# Patient Record
Sex: Male | Born: 1995 | Race: Black or African American | Hispanic: No | Marital: Single | Smoking: Current every day smoker
Health system: Southern US, Community
[De-identification: ages and names within clinical notes are randomized; demographics above are authoritative.]

## PROBLEM LIST (undated history)

## (undated) DIAGNOSIS — J45909 Unspecified asthma, uncomplicated: Secondary | ICD-10-CM

---
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
## 2013-03-20 ENCOUNTER — Emergency Department (HOSPITAL_COMMUNITY)
Admission: EM | Admit: 2013-03-20 | Discharge: 2013-03-20 | Disposition: A | Discharge status: Home / Self Care | Payer: Medicaid Other | Attending: Emergency Medicine | Admitting: Emergency Medicine

## 2013-03-20 ENCOUNTER — Encounter (HOSPITAL_COMMUNITY): Payer: Self-pay | Admitting: Emergency Medicine

## 2013-03-20 DIAGNOSIS — F17200 Nicotine dependence, unspecified, uncomplicated: Secondary | ICD-10-CM | POA: Insufficient documentation

## 2013-03-20 DIAGNOSIS — IMO0002 Reserved for concepts with insufficient information to code with codable children: Secondary | ICD-10-CM | POA: Insufficient documentation

## 2013-03-20 DIAGNOSIS — H11421 Conjunctival edema, right eye: Secondary | ICD-10-CM

## 2013-03-20 DIAGNOSIS — Y929 Unspecified place or not applicable: Secondary | ICD-10-CM | POA: Insufficient documentation

## 2013-03-20 DIAGNOSIS — Y939 Activity, unspecified: Secondary | ICD-10-CM | POA: Insufficient documentation

## 2013-03-20 DIAGNOSIS — S0591XA Unspecified injury of right eye and orbit, initial encounter: Secondary | ICD-10-CM

## 2013-03-20 DIAGNOSIS — J45909 Unspecified asthma, uncomplicated: Secondary | ICD-10-CM | POA: Insufficient documentation

## 2013-03-20 DIAGNOSIS — S0510XA Contusion of eyeball and orbital tissues, unspecified eye, initial encounter: Secondary | ICD-10-CM | POA: Insufficient documentation

## 2013-03-20 DIAGNOSIS — H11429 Conjunctival edema, unspecified eye: Secondary | ICD-10-CM | POA: Insufficient documentation

## 2013-03-20 MED ORDER — OXYCODONE-ACETAMINOPHEN 5-325 MG PO TABS: 1.0000 | ORAL_TABLET | Freq: Four times a day (QID) | ORAL | Status: DC | PRN

## 2013-03-20 MED ORDER — OXYCODONE-ACETAMINOPHEN 5-325 MG PO TABS
2.0000 | ORAL_TABLET | Freq: Once | ORAL | Status: AC
  Administered 2013-03-20: 2 via ORAL
  Filled 2013-03-20: qty 2

## 2013-03-20 MED ORDER — FLUORESCEIN SODIUM 1 MG OP STRP
1.0000 | ORAL_STRIP | Freq: Once | OPHTHALMIC | Status: AC
  Administered 2013-03-20: 1 via OPHTHALMIC
  Filled 2013-03-20: qty 1

## 2013-03-20 MED ORDER — TETRACAINE HCL 0.5 % OP SOLN
2.0000 [drp] | Freq: Once | OPHTHALMIC | Status: AC
  Administered 2013-03-20: 2 [drp] via OPHTHALMIC
  Filled 2013-03-20: qty 2

## 2013-03-20 NOTE — ED Provider Notes (Signed)
Medical screening examination/treatment/procedure(s) were conducted as a shared visit with non-physician practitioner(s) and myself.  I personally evaluated the patient during the encounter.  EKG Interpretation   None      Patient's right eye with chemosis but without signs of ocular entrapment or hyphema. Possible corneal abrasion. Denies any foreign body sensation however. Patient's vision is normal.  Toy Baker, MD 03/20/13 2204
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
## 2013-03-23 NOTE — ED Provider Notes (Signed)
Medical screening examination/treatment/procedure(s) were conducted as a shared visit with non-physician practitioner(s) and myself.  I personally evaluated the patient during the encounter.  EKG Interpretation   None        Toy Baker, MD 03/23/13 [phone]

## 2015-03-13 ENCOUNTER — Encounter (HOSPITAL_COMMUNITY): Payer: Self-pay | Admitting: Nurse Practitioner

## 2015-03-13 ENCOUNTER — Emergency Department (HOSPITAL_COMMUNITY)
Admission: EM | Admit: 2015-03-13 | Discharge: 2015-03-13 | Disposition: A | Discharge status: Home / Self Care | Payer: Self-pay | Attending: Emergency Medicine | Admitting: Emergency Medicine

## 2015-03-13 ENCOUNTER — Emergency Department (HOSPITAL_COMMUNITY): Payer: Self-pay

## 2015-03-13 ENCOUNTER — Emergency Department (HOSPITAL_COMMUNITY): Payer: Medicaid Other

## 2015-03-13 DIAGNOSIS — W010XXA Fall on same level from slipping, tripping and stumbling without subsequent striking against object, initial encounter: Secondary | ICD-10-CM

## 2015-03-13 DIAGNOSIS — Y9302 Activity, running: Secondary | ICD-10-CM | POA: Insufficient documentation

## 2015-03-13 DIAGNOSIS — Y998 Other external cause status: Secondary | ICD-10-CM | POA: Insufficient documentation

## 2015-03-13 DIAGNOSIS — S8991XA Unspecified injury of right lower leg, initial encounter: Secondary | ICD-10-CM | POA: Insufficient documentation

## 2015-03-13 DIAGNOSIS — J45909 Unspecified asthma, uncomplicated: Secondary | ICD-10-CM | POA: Insufficient documentation

## 2015-03-13 DIAGNOSIS — Y92410 Unspecified street and highway as the place of occurrence of the external cause: Secondary | ICD-10-CM | POA: Insufficient documentation

## 2015-03-13 DIAGNOSIS — F17200 Nicotine dependence, unspecified, uncomplicated: Secondary | ICD-10-CM | POA: Insufficient documentation

## 2015-03-13 DIAGNOSIS — S60222A Contusion of left hand, initial encounter: Secondary | ICD-10-CM | POA: Insufficient documentation

## 2015-03-13 DIAGNOSIS — S6000XA Contusion of unspecified finger without damage to nail, initial encounter: Secondary | ICD-10-CM

## 2015-03-13 DIAGNOSIS — S93401A Sprain of unspecified ligament of right ankle, initial encounter: Secondary | ICD-10-CM | POA: Insufficient documentation

## 2015-03-13 DIAGNOSIS — S60052A Contusion of left little finger without damage to nail, initial encounter: Secondary | ICD-10-CM | POA: Insufficient documentation

## 2015-03-13 MED ORDER — IBUPROFEN 800 MG PO TABS: 800.0000 mg | ORAL_TABLET | Freq: Once | ORAL | Status: DC

## 2015-03-13 MED ORDER — IBUPROFEN 800 MG PO TABS
800.0000 mg | ORAL_TABLET | Freq: Once | ORAL | Status: AC
  Administered 2015-03-13: 800 mg via ORAL
  Filled 2015-03-13: qty 1
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
## 2015-03-13 NOTE — Discharge Instructions (Signed)
Wear ankle splint orthotic as needed.  Ankle Sprain An ankle sprain is an injury to the strong, fibrous tissues (ligaments) that hold the bones of your ankle joint together.  CAUSES An ankle sprain is usually caused by a fall or by twisting your ankle. Ankle sprains most commonly occur when you step on the outer edge of your foot, and your ankle turns inward. People who participate in sports are more prone to these types of injuries.  SYMPTOMS   Pain in your ankle. The pain may be present at rest or only when you are trying to stand or walk.  Swelling.  Bruising. Bruising may develop immediately or within 1 to 2 days after your injury.  Difficulty standing or walking, particularly when turning corners or changing directions. DIAGNOSIS  Your caregiver will ask you details about your injury and perform a physical exam of your ankle to determine if you have an ankle sprain. During the physical exam, your caregiver will press on and apply pressure to specific areas of your foot and ankle. Your caregiver will try to move your ankle in certain ways. An X-ray exam may be done to be sure a bone was not broken or a ligament did not separate from one of the bones in your ankle (avulsion fracture).  TREATMENT  Certain types of braces can help stabilize your ankle. Your caregiver can make a recommendation for this. Your caregiver may recommend the use of medicine for pain. If your sprain is severe, your caregiver may refer you to a surgeon who helps to restore function to parts of your skeletal system (orthopedist) or a physical therapist. HOME CARE INSTRUCTIONS  1. Apply ice to your injury for 1-2 days or as directed by your caregiver. Applying ice helps to reduce inflammation and pain. 1. Put ice in a plastic bag. 2. Place a towel between your skin and the bag. 3. Leave the ice on for 15-20 minutes at a time, every 2 hours while you are awake. 2. Only take over-the-counter or prescription medicines for  pain, discomfort, or fever as directed by your caregiver. 3. Elevate your injured ankle above the level of your heart as much as possible for 2-3 days. 4. If your caregiver recommends crutches, use them as instructed. Gradually put weight on the affected ankle. Continue to use crutches or a cane until you can walk without feeling pain in your ankle. 5. If you have a plaster splint, wear the splint as directed by your caregiver. Do not rest it on anything harder than a pillow for the first 24 hours. Do not put weight on it. Do not get it wet. You may take it off to take a shower or bath. 6. You may have been given an elastic bandage to wear around your ankle to provide support. If the elastic bandage is too tight (you have numbness or tingling in your foot or your foot becomes cold and blue), adjust the bandage to make it comfortable. 7. If you have an air splint, you may blow more air into it or let air out to make it more comfortable. You may take your splint off at night and before taking a shower or bath. Wiggle your toes in the splint several times per day to decrease swelling. SEEK MEDICAL CARE IF:  1. You have rapidly increasing bruising or swelling. 2. Your toes feel extremely cold or you lose feeling in your foot. 3. Your pain is not relieved with medicine. SEEK IMMEDIATE MEDICAL CARE IF:  1. Your toes are numb or blue. 2. You have severe pain that is increasing. MAKE SURE YOU:  1. Understand these instructions. 2. Will watch your condition. 3. Will get help right away if you are not doing well or get worse.   This information is not intended to replace advice given to you by your health care provider. Make sure you discuss any questions you have with your health care provider.   Document Released: 03/21/2005 Document Revised: 04/11/2014 Document Reviewed: 04/02/2011 Elsevier Interactive Patient Education 2016 [employer]   Contusion A contusion is a deep bruise. Contusions are the  result of a blunt injury to tissues and muscle fibers under the skin. The injury causes bleeding under the skin. The skin overlying the contusion may turn blue, purple, or yellow. Minor injuries will give you a painless contusion, but more severe contusions may stay painful and swollen for a few weeks.  CAUSES  This condition is usually caused by a blow, trauma, or direct force to an area of the body. SYMPTOMS  Symptoms of this condition include:  Swelling of the injured area.  Pain and tenderness in the injured area.  Discoloration. The area may have redness and then turn blue, purple, or yellow. DIAGNOSIS  This condition is diagnosed based on a physical exam and medical history. An X-ray, CT scan, or MRI may be needed to determine if there are any associated injuries, such as broken bones (fractures). TREATMENT  Specific treatment for this condition depends on what area of the body was injured. In general, the best treatment for a contusion is resting, icing, applying pressure to (compression), and elevating the injured area. This is often called the RICE strategy. Over-the-counter anti-inflammatory medicines may also be recommended for pain control.  HOME CARE INSTRUCTIONS  8. Rest the injured area. 9. If directed, apply ice to the injured area: 1. Put ice in a plastic bag. 2. Place a towel between your skin and the bag. 3. Leave the ice on for 20 minutes, 2-3 times per day. 10. If directed, apply light compression to the injured area using an elastic bandage. Make sure the bandage is not wrapped too tightly. Remove and reapply the bandage as directed by your health care provider. 11. If possible, raise (elevate) the injured area above the level of your heart while you are sitting or lying down. 12. Take over-the-counter and prescription medicines only as told by your health care provider. SEEK MEDICAL CARE IF: 4. Your symptoms do not improve after several days of treatment. 5. Your  symptoms get worse. 6. You have difficulty moving the injured area. SEEK IMMEDIATE MEDICAL CARE IF:  3. You have severe pain. 4. You have numbness in a hand or foot. 5. Your hand or foot turns pale or cold.   This information is not intended to replace advice given to you by your health care provider. Make sure you discuss any questions you have with your health care provider.   Document Released: 12/29/2004 Document Revised: 12/10/2014 Document Reviewed: 08/06/2014 Elsevier Interactive Patient Education 2016 [employer].  Crutch Use Crutches are used to take weight off one of your legs or feet when you stand or walk. It is important to use crutches that fit properly. When fitted properly:  Each crutch should be 2-3 finger widths below the armpit.  Your weight should be supported by your hand, and not by resting the armpit on the crutch. RISKS AND COMPLICATIONS Damage to the nerves that extend from your  armpit to your hand and arm. To prevent this from happening, make sure your crutches fit properly and do not put pressure on your armpit when using them. HOW TO USE YOUR CRUTCHES If you have been instructed to use partial weight bearing, apply (bear) the amount of weight as your health care provider suggests. Do not bear weight in an amount that causes pain to the area of injury. Walking 13. Step with the crutches. 14. Swing the healthy leg slightly ahead of the crutches. Going Up Steps If there is no handrail: 7. Step up with the healthy leg. 8. Step up with the crutches and injured leg. 9. Continue in this way. If there is a handrail: 6. Hold both crutches in one hand. 7. Place your free hand on the handrail. 8. While putting your weight on your arms, lift your healthy leg to the step. 9. Bring the crutches and the injured leg up to that step. 10. Continue in this way. Going Down Steps Be very careful, as going down stairs with crutches is very challenging. If there is no  handrail: 4. Step down with the injured leg and crutches. 5. Step down with the healthy leg. If there is a handrail: 1. Place your hand on the handrail. 2. Hold both crutches with your free hand. 3. Lower your injured leg and crutch to the step below you. Make sure to keep the crutch tips in the center of the step, never on the edge. 4. Lower your healthy leg to that step. 5. Continue in this way. Standing Up 1. Hold the injured leg forward. 2. Grab the armrest with one hand and the top of the crutches with the other hand. 3. Using these supports, pull yourself up to a standing position. Sitting Down 1. Hold the injured leg forward. 2. Grab the armrest with one hand and the top of the crutches with the other hand. 3. Lower yourself to a sitting position. SEEK MEDICAL CARE IF:  You still feel unsteady on your feet.  You develop new pain, for example in your armpits, back, shoulder, wrist, or hip.  You develop any numbness or tingling. SEEK IMMEDIATE MEDICAL CARE IF:  You fall.   This information is not intended to replace advice given to you by your health care provider. Make sure you discuss any questions you have with your health care provider.   Document Released: 03/18/2000 Document Revised: 04/11/2014 Document Reviewed: 11/26/2012 Elsevier Interactive Patient Education 2016 [employer] Inc.  Ibuprofen tablets and capsules What is this medicine? IBUPROFEN (eye BYOO proe fen) is a non-steroidal anti-inflammatory drug (NSAID). It is used for dental pain, fever, headaches or migraines, osteoarthritis, rheumatoid arthritis, or painful monthly periods. It can also relieve minor aches and pains caused by a cold, flu, or sore throat. This medicine may be used for other purposes; ask your health care provider or pharmacist if you have questions. What should I tell my health care provider before I take this medicine? They need to know if you have any of these  conditions: -asthma -cigarette smoker -drink more than 3 alcohol containing drinks a day -heart disease or circulation problems such as heart failure or leg edema (fluid retention) -high blood pressure -kidney disease -liver disease -stomach bleeding or ulcers -an unusual or allergic reaction to ibuprofen, aspirin, other NSAIDS, other medicines, foods, dyes, or preservatives -pregnant or trying to get pregnant -breast-feeding How should I use this medicine? Take this medicine by mouth with a glass of water. Follow the  directions on the prescription label. Take this medicine with food if your stomach gets upset. Try to not lie down for at least 10 minutes after you take the medicine. Take your medicine at regular intervals. Do not take your medicine more often than directed. A special MedGuide will be given to you by the pharmacist with each prescription and refill. Be sure to read this information carefully each time. Talk to your pediatrician regarding the use of this medicine in children. Special care may be needed. Overdosage: If you think you have taken too much of this medicine contact a poison control center or emergency room at once. NOTE: This medicine is only for you. Do not share this medicine with others. What if I miss a dose? If you miss a dose, take it as soon as you can. If it is almost time for your next dose, take only that dose. Do not take double or extra doses. What may interact with this medicine? Do not take this medicine with any of the following medications: -cidofovir -ketorolac -methotrexate -pemetrexed This medicine may also interact with the following medications: -alcohol -aspirin -diuretics -lithium -other drugs for inflammation like prednisone -warfarin This list may not describe all possible interactions. Give your health care provider a list of all the medicines, herbs, non-prescription drugs, or dietary supplements you use. Also tell them if you  smoke, drink alcohol, or use illegal drugs. Some items may interact with your medicine. What should I watch for while using this medicine? Tell your doctor or healthcare professional if your symptoms do not start to get better or if they get worse. This medicine does not prevent heart attack or stroke. In fact, this medicine may increase the chance of a heart attack or stroke. The chance may increase with longer use of this medicine and in people who have heart disease. If you take aspirin to prevent heart attack or stroke, talk with your doctor or health care professional. Do not take other medicines that contain aspirin, ibuprofen, or naproxen with this medicine. Side effects such as stomach upset, nausea, or ulcers may be more likely to occur. Many medicines available without a prescription should not be taken with this medicine. This medicine can cause ulcers and bleeding in the stomach and intestines at any time during treatment. Ulcers and bleeding can happen without warning symptoms and can cause death. To reduce your risk, do not smoke cigarettes or drink alcohol while you are taking this medicine. You may get drowsy or dizzy. Do not drive, use machinery, or do anything that needs mental alertness until you know how this medicine affects you. Do not stand or sit up quickly, especially if you are an older patient. This reduces the risk of dizzy or fainting spells. This medicine can cause you to bleed more easily. Try to avoid damage to your teeth and gums when you brush or floss your teeth. This medicine may be used to treat migraines. If you take migraine medicines for 10 or more days a month, your migraines may get worse. Keep a diary of headache days and medicine use. Contact your healthcare professional if your migraine attacks occur more frequently. What side effects may I notice from receiving this medicine? Side effects that you should report to your doctor or health care professional as soon  as possible: -allergic reactions like skin rash, itching or hives, swelling of the face, lips, or tongue -severe stomach pain -signs and symptoms of bleeding such as bloody or black, tarry  stools; red or dark-brown urine; spitting up blood or brown material that looks like coffee grounds; red spots on the skin; unusual bruising or bleeding from the eye, gums, or nose -signs and symptoms of a blood clot such as changes in vision; chest pain; severe, sudden headache; trouble speaking; sudden numbness or weakness of the face, arm, or leg -unexplained weight gain or swelling -unusually weak or tired -yellowing of eyes or skin Side effects that usually do not require medical attention (report to your doctor or health care professional if they continue or are bothersome): -bruising -diarrhea -dizziness, drowsiness -headache -nausea, vomiting This list may not describe all possible side effects. Call your doctor for medical advice about side effects. You may report side effects to FDA at 1-800-FDA-1088. Where should I keep my medicine? Keep out of the reach of children. Store at room temperature between 15 and 30 degrees C (59 and 86 degrees F). Keep container tightly closed. Throw away any unused medicine after the expiration date. NOTE: This sheet is a summary. It may not cover all possible information. If you have questions about this medicine, talk to your doctor, pharmacist, or health care provider.    2016, Elsevier/Gold Standard. (2012-11-20 10:48:02)
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
## 2015-03-13 NOTE — ED Notes (Signed)
Bed: WA18 Expected date:  Expected time:  Means of arrival:  Comments: EMS 

## 2015-09-17 ENCOUNTER — Emergency Department (HOSPITAL_COMMUNITY)
Admission: EM | Admit: 2015-09-17 | Discharge: 2015-09-18 | Disposition: A | Discharge status: Home / Self Care | Payer: No Typology Code available for payment source | Attending: Emergency Medicine | Admitting: Emergency Medicine

## 2015-09-17 ENCOUNTER — Encounter (HOSPITAL_COMMUNITY): Payer: Self-pay | Admitting: Emergency Medicine

## 2015-09-17 DIAGNOSIS — F17200 Nicotine dependence, unspecified, uncomplicated: Secondary | ICD-10-CM | POA: Insufficient documentation

## 2015-09-17 DIAGNOSIS — Z711 Person with feared health complaint in whom no diagnosis is made: Secondary | ICD-10-CM

## 2015-09-17 DIAGNOSIS — R1030 Lower abdominal pain, unspecified: Secondary | ICD-10-CM | POA: Insufficient documentation

## 2015-09-17 DIAGNOSIS — R300 Dysuria: Secondary | ICD-10-CM | POA: Insufficient documentation

## 2015-09-17 DIAGNOSIS — R112 Nausea with vomiting, unspecified: Secondary | ICD-10-CM | POA: Insufficient documentation

## 2015-09-17 DIAGNOSIS — J45909 Unspecified asthma, uncomplicated: Secondary | ICD-10-CM | POA: Insufficient documentation

## 2015-09-17 LAB — CBC WITH DIFFERENTIAL/PLATELET
BASOS PCT: 0 %
Basophils Absolute: 0 10*3/uL (ref 0.0–0.1)
EOS ABS: 0.1 10*3/uL (ref 0.0–0.7)
Eosinophils Relative: 2 %
HCT: 41.1 % (ref 39.0–52.0)
HEMOGLOBIN: 13 g/dL (ref 13.0–17.0)
Lymphocytes Relative: 45 %
Lymphs Abs: 3.1 10*3/uL (ref 0.7–4.0)
MCH: 27.1 pg (ref 26.0–34.0)
MCHC: 31.6 g/dL (ref 30.0–36.0)
MCV: 85.8 fL (ref 78.0–100.0)
MONOS PCT: 8 %
Monocytes Absolute: 0.5 10*3/uL (ref 0.1–1.0)
NEUTROS PCT: 45 %
Neutro Abs: 3.2 10*3/uL (ref 1.7–7.7)
Platelets: 201 10*3/uL (ref 150–400)
RBC: 4.79 MIL/uL (ref 4.22–5.81)
RDW: 13 % (ref 11.5–15.5)
WBC: 7 10*3/uL (ref 4.0–10.5)

## 2015-09-17 LAB — COMPREHENSIVE METABOLIC PANEL
ALBUMIN: 4 g/dL (ref 3.5–5.0)
ALK PHOS: 41 U/L (ref 38–126)
ALT: 22 U/L (ref 17–63)
ANION GAP: 7 (ref 5–15)
AST: 32 U/L (ref 15–41)
BUN: 12 mg/dL (ref 6–20)
CALCIUM: 9.3 mg/dL (ref 8.9–10.3)
CO2: 24 mmol/L (ref 22–32)
Chloride: 107 mmol/L (ref 101–111)
Creatinine, Ser: 1.11 mg/dL (ref 0.61–1.24)
GFR calc Af Amer: >60 mL/min (ref >60)
GFR calc non Af Amer: >60 mL/min (ref >60)
GLUCOSE: 100 mg/dL — AB (ref 65–99)
POTASSIUM: 3.9 mmol/L (ref 3.5–5.1)
SODIUM: 138 mmol/L (ref 135–145)
Total Bilirubin: 0.4 mg/dL (ref 0.3–1.2)
Total Protein: 6.7 g/dL (ref 6.5–8.1)

## 2015-09-17 LAB — URINALYSIS, ROUTINE W REFLEX MICROSCOPIC
Bilirubin Urine: NEGATIVE
Glucose, UA: NEGATIVE mg/dL
Hgb urine dipstick: NEGATIVE
Ketones, ur: NEGATIVE mg/dL
LEUKOCYTES UA: NEGATIVE
NITRITE: NEGATIVE
PH: 6 (ref 5.0–8.0)
Protein, ur: NEGATIVE mg/dL
SPECIFIC GRAVITY, URINE: 1.025 (ref 1.005–1.030)

## 2015-09-17 LAB — LIPASE, BLOOD: Lipase: 25 U/L (ref 11–51)

## 2015-09-17 NOTE — ED Notes (Signed)
Pt c/o mid to lower abd pain onset yesterday with nausea and vomiting.  Denies diarrhea

## 2015-09-18 LAB — HIV ANTIBODY (ROUTINE TESTING W REFLEX): HIV Screen 4th Generation wRfx: NONREACTIVE

## 2015-09-18 LAB — GC/CHLAMYDIA PROBE AMP (~~LOC~~) NOT AT ARMC
CHLAMYDIA, DNA PROBE: NEGATIVE
NEISSERIA GONORRHEA: NEGATIVE

## 2015-09-18 LAB — RPR: RPR Ser Ql: NONREACTIVE

## 2015-09-18 MED ORDER — ONDANSETRON 4 MG PO TBDP: ORAL_TABLET | ORAL | Status: DC

## 2015-09-18 MED ORDER — CEFTRIAXONE SODIUM 250 MG IJ SOLR
250.0000 mg | Freq: Once | INTRAMUSCULAR | Status: AC
  Administered 2015-09-18: 250 mg via INTRAMUSCULAR
  Filled 2015-09-18: qty 250

## 2015-09-18 MED ORDER — AZITHROMYCIN 250 MG PO TABS
1000.0000 mg | ORAL_TABLET | Freq: Once | ORAL | Status: AC
  Administered 2015-09-18: 1000 mg via ORAL
  Filled 2015-09-18: qty 4

## 2015-09-18 MED ORDER — ONDANSETRON 4 MG PO TBDP
8.0000 mg | ORAL_TABLET | Freq: Once | ORAL | Status: AC
  Administered 2015-09-18: 8 mg via ORAL
  Filled 2015-09-18: qty 2

## 2015-09-18 MED ORDER — LIDOCAINE HCL (PF) 1 % IJ SOLN
5.0000 mL | Freq: Once | INTRAMUSCULAR | Status: AC
  Administered 2015-09-18: 5 mL via INTRADERMAL
  Filled 2015-09-18: qty 5

## 2015-09-18 NOTE — Discharge Instructions (Signed)
1. Medications: zofran as needed for nausea, usual home medications 2. Treatment: rest, drink plenty of fluids, use a condom with every sexual encounter 3. Follow Up: Please followup with your primary doctor in 3 days for discussion of your diagnoses and further evaluation after today's visit; if you do not have a primary care doctor use the resource guide provided to find one; Please return to the ER for worsening symptoms, high fevers or persistent vomiting.  You have been tested for HIV, syphilis, chlamydia and gonorrhea.  These results will be available in approximately 3 days.  Please inform all sexual partners if you test positive for any of these diseases.

## 2015-09-18 NOTE — ED Provider Notes (Signed)
CSN: [id number]     Arrival date & time 09/17/15  2150 History   First MD Initiated Contact with Patient 09/18/15 0000     Chief Complaint  Patient presents with  . Abdominal Pain     (Consider location/radiation/quality/duration/timing/severity/associated sxs/prior Treatment) The history is provided by the patient and medical records. No language interpreter was used.     Chad Douglas is a 20 y.o. male  with a hx of asthma presents to the Emergency Department complaining of gradual, persistent, lower abd pain onset yesterday morning. Associated symptoms include dysuria, penile discharge, NBNB emesis x2.  Pt reports new male sexual partner in the last several weeks.  He reports last BM was yesterday and without diarrhea or rectal pain.  No fever or chills.  No hematuria.  No treatments PTA.  Nothing makes it better and nothing makes it worse.  Pt denies fever, chills, headache, neck pain, chest pain, SOB, diarrhea, weakness, dizziness, syncope.     Past Medical History  Diagnosis Date  . Asthma    History reviewed. No pertinent past surgical history. No family history on file. Social History  Substance Use Topics  . Smoking status: Current Every Day Smoker  . Smokeless tobacco: None  . Alcohol Use: No    Review of Systems  Constitutional: Negative for fever, diaphoresis, appetite change, fatigue and unexpected weight change.  HENT: Negative for mouth sores.   Eyes: Negative for visual disturbance.  Respiratory: Negative for cough, chest tightness, shortness of breath and wheezing.   Cardiovascular: Negative for chest pain.  Gastrointestinal: Positive for nausea, vomiting (x2) and abdominal pain. Negative for diarrhea and constipation.  Endocrine: Negative for polydipsia, polyphagia and polyuria.  Genitourinary: Negative for dysuria, urgency, frequency and hematuria.  Musculoskeletal: Negative for back pain and neck stiffness.  Skin: Negative for rash.    Allergic/Immunologic: Negative for immunocompromised state.  Neurological: Negative for syncope, light-headedness and headaches.  Hematological: Does not bruise/bleed easily.  Psychiatric/Behavioral: Negative for sleep disturbance. The patient is not nervous/anxious.       Allergies  Review of patient's allergies indicates no known allergies.  Home Medications   Prior to Admission medications   Medication Sig Start Date End Date Taking? Authorizing Provider  ibuprofen (ADVIL,MOTRIN) 800 MG tablet Take 1 tablet (800 mg total) by mouth once. 03/13/15   Dione Booze, MD  ondansetron (ZOFRAN ODT) 4 MG disintegrating tablet  ODT q4 hours prn nausea/vomit 09/18/15   Bianka Liberati, PA-C   BP 148/80 mmHg  Pulse 62  Temp(Src) 99.4 F (37.4 C) (Oral)  Resp 19  Ht  (1.702 m)  Wt 74.844 kg  BMI 25.84 kg/m2  SpO2 99% Physical Exam  Constitutional: He appears well-developed and well-nourished. No distress.  Awake, alert, nontoxic appearance  HENT:  Head: Normocephalic and atraumatic.  Mouth/Throat: Oropharynx is clear and moist. No oropharyngeal exudate.  Eyes: Conjunctivae are normal. No scleral icterus.  Neck: Normal range of motion. Neck supple.  Cardiovascular: Normal rate, regular rhythm and intact distal pulses.   Pulmonary/Chest: Effort normal and breath sounds normal. No respiratory distress. He has no wheezes.  Equal chest expansion  Abdominal: Soft. Bowel sounds are normal. He exhibits no distension and no mass. There is tenderness ( reported) in the right lower quadrant, suprapubic area and left lower quadrant. There is no rebound, no guarding and no CVA tenderness. Hernia confirmed negative in the right inguinal area and confirmed negative in the left inguinal area.    Reported lower abdominal  tenderness on exam however patient does not appear uncomfortable during palpation, abdomen is soft without guarding or rebound and no CVA tenderness  Genitourinary: Testes  normal. Right testis shows no mass, no swelling and no tenderness. Right testis is descended. Cremasteric reflex is not absent on the right side. Left testis shows no mass, no swelling and no tenderness. Left testis is descended. Cremasteric reflex is not absent on the left side. No phimosis, paraphimosis, hypospadias, penile erythema or penile tenderness. Discharge ( minimal) found.  Musculoskeletal: Normal range of motion. He exhibits no edema.  Lymphadenopathy:       Right: No inguinal adenopathy present.       Left: No inguinal adenopathy present.  Neurological: He is alert.  Speech is clear and goal oriented Moves extremities without ataxia  Skin: Skin is warm and dry. He is not diaphoretic.  Psychiatric: He has a normal mood and affect.  Nursing note and vitals reviewed.   ED Course  Procedures (including critical care time) Labs Review Labs Reviewed  COMPREHENSIVE METABOLIC PANEL - Abnormal; Notable for the following:    Glucose, Bld 100 (*)    All other components within normal limits  CBC WITH DIFFERENTIAL/PLATELET  LIPASE, BLOOD  URINALYSIS, ROUTINE W REFLEX MICROSCOPIC (NOT AT [facility])  RPR  HIV ANTIBODY (ROUTINE TESTING)  GC/CHLAMYDIA PROBE AMP ([place]) NOT AT [facility]    MDM   Final diagnoses:  Lower abdominal pain  Non-intractable vomiting with nausea, vomiting of unspecified type  Concern about STD in male without diagnosis  Dysuria   Cresenciano LickDesmond T Ferencz presents with lower abd pain.  No focal pain.  No rebound or guarding.  She also with dysuria. Urinalysis does not show evidence of urinary tract infection however there is a small amount of penile discharge on exam.  Patient is afebrile without painful bowel movements. Doubt prostatitis at this time. All the labs reassuring including normal white blood cell count.  No tenderness to palpation of the testes or epididymis to suggest orchitis or epididymitis.  STD cultures obtained including HIV, syphilis, gonorrhea and  chlamydia. Patient to be discharged with instructions to follow up with PCP. Discussed importance of using protection when sexually active. Pt understands that they have GC/Chlamydia cultures pending and that they will need to inform all sexual partners if results return positive. Patient has been treated prophylactically with azithromycin and Rocephin.  Discussed reasons to return to the emergency department including persistent vomiting, development of fevers or worsening abdominal pain.       Dahlia ClientHannah Mazzy Santarelli, PA-C 09/18/15 0120  Melene Planan Floyd, DO 09/18/15 2332

## 2015-09-18 NOTE — ED Notes (Signed)
Patient verbalized understanding of discharge instructions and denies any further needs or questions at this time. VS stable. Patient ambulatory with steady gait.  

## 2016-03-31 ENCOUNTER — Encounter (HOSPITAL_COMMUNITY): Payer: Self-pay | Admitting: Emergency Medicine

## 2016-03-31 ENCOUNTER — Emergency Department (HOSPITAL_COMMUNITY)
Admission: EM | Admit: 2016-03-31 | Discharge: 2016-03-31 | Disposition: A | Discharge status: Home / Self Care | Payer: Self-pay | Attending: Emergency Medicine | Admitting: Emergency Medicine

## 2016-03-31 DIAGNOSIS — Z5321 Procedure and treatment not carried out due to patient leaving prior to being seen by health care provider: Secondary | ICD-10-CM | POA: Insufficient documentation

## 2016-03-31 DIAGNOSIS — R1030 Lower abdominal pain, unspecified: Secondary | ICD-10-CM | POA: Insufficient documentation

## 2016-03-31 LAB — CBC
HCT: 42.7 % (ref 39.0–52.0)
HEMOGLOBIN: 14.2 g/dL (ref 13.0–17.0)
MCH: 29.1 pg (ref 26.0–34.0)
MCHC: 33.3 g/dL (ref 30.0–36.0)
MCV: 87.5 fL (ref 78.0–100.0)
PLATELETS: 223 10*3/uL (ref 150–400)
RBC: 4.88 MIL/uL (ref 4.22–5.81)
RDW: 13.6 % (ref 11.5–15.5)
WBC: 7.9 10*3/uL (ref 4.0–10.5)

## 2016-03-31 LAB — COMPREHENSIVE METABOLIC PANEL
ALT: 18 U/L (ref 17–63)
ANION GAP: 10 (ref 5–15)
AST: 28 U/L (ref 15–41)
Albumin: 4 g/dL (ref 3.5–5.0)
Alkaline Phosphatase: 56 U/L (ref 38–126)
BUN: 11 mg/dL (ref 6–20)
CHLORIDE: 106 mmol/L (ref 101–111)
CO2: 24 mmol/L (ref 22–32)
CREATININE: 1 mg/dL (ref 0.61–1.24)
Calcium: 9.6 mg/dL (ref 8.9–10.3)
Glucose, Bld: 103 mg/dL — ABNORMAL HIGH (ref 65–99)
Potassium: 4 mmol/L (ref 3.5–5.1)
Sodium: 140 mmol/L (ref 135–145)
Total Bilirubin: 0.4 mg/dL (ref 0.3–1.2)
Total Protein: 7.3 g/dL (ref 6.5–8.1)

## 2016-03-31 LAB — URINALYSIS, ROUTINE W REFLEX MICROSCOPIC
BILIRUBIN URINE: NEGATIVE
Bacteria, UA: NONE SEEN
GLUCOSE, UA: NEGATIVE mg/dL
Hgb urine dipstick: NEGATIVE
KETONES UR: NEGATIVE mg/dL
Nitrite: NEGATIVE
PH: 6 (ref 5.0–8.0)
Protein, ur: 30 mg/dL — AB
Specific Gravity, Urine: 1.029 (ref 1.005–1.030)

## 2016-03-31 LAB — LIPASE, BLOOD: LIPASE: 20 U/L (ref 11–51)

## 2016-03-31 NOTE — ED Notes (Signed)
Pt stated that he is unable to wait any longer due to work in the morning. Pt stated he will come back tomorrow. Pt left out ED lobby doors @0356  on 03/31/16

## 2016-03-31 NOTE — ED Triage Notes (Signed)
Patient reports low abdominal pain with dysuria onset this week , denies emesis or diarrhea . No fever or chills.

## 2016-04-11 ENCOUNTER — Emergency Department (HOSPITAL_COMMUNITY): Payer: Self-pay

## 2016-04-11 ENCOUNTER — Emergency Department (HOSPITAL_COMMUNITY)
Admission: EM | Admit: 2016-04-11 | Discharge: 2016-04-12 | Disposition: A | Discharge status: Home / Self Care | Payer: Self-pay | Attending: Emergency Medicine | Admitting: Emergency Medicine

## 2016-04-11 ENCOUNTER — Encounter (HOSPITAL_COMMUNITY): Payer: Self-pay

## 2016-04-11 DIAGNOSIS — N342 Other urethritis: Secondary | ICD-10-CM | POA: Insufficient documentation

## 2016-04-11 DIAGNOSIS — F17210 Nicotine dependence, cigarettes, uncomplicated: Secondary | ICD-10-CM | POA: Insufficient documentation

## 2016-04-11 DIAGNOSIS — J40 Bronchitis, not specified as acute or chronic: Secondary | ICD-10-CM | POA: Insufficient documentation

## 2016-04-11 LAB — URINALYSIS, ROUTINE W REFLEX MICROSCOPIC
BACTERIA UA: NONE SEEN
BILIRUBIN URINE: NEGATIVE
Glucose, UA: NEGATIVE mg/dL
Hgb urine dipstick: NEGATIVE
KETONES UR: NEGATIVE mg/dL
Nitrite: NEGATIVE
Protein, ur: NEGATIVE mg/dL
Specific Gravity, Urine: 1.027 (ref 1.005–1.030)
pH: 6 (ref 5.0–8.0)

## 2016-04-11 LAB — CBC
HEMATOCRIT: 43.4 % (ref 39.0–52.0)
HEMOGLOBIN: 14.9 g/dL (ref 13.0–17.0)
MCH: 30 pg (ref 26.0–34.0)
MCHC: 34.3 g/dL (ref 30.0–36.0)
MCV: 87.5 fL (ref 78.0–100.0)
Platelets: 203 10*3/uL (ref 150–400)
RBC: 4.96 MIL/uL (ref 4.22–5.81)
RDW: 13.3 % (ref 11.5–15.5)
WBC: 8.4 10*3/uL (ref 4.0–10.5)

## 2016-04-11 LAB — BASIC METABOLIC PANEL
ANION GAP: 9 (ref 5–15)
BUN: 10 mg/dL (ref 6–20)
CHLORIDE: 103 mmol/L (ref 101–111)
CO2: 27 mmol/L (ref 22–32)
Calcium: 9.5 mg/dL (ref 8.9–10.3)
Creatinine, Ser: 1.17 mg/dL (ref 0.61–1.24)
GFR calc Af Amer: >60 mL/min (ref >60)
GFR calc non Af Amer: >60 mL/min (ref >60)
Glucose, Bld: 132 mg/dL — ABNORMAL HIGH (ref 65–99)
POTASSIUM: 3.6 mmol/L (ref 3.5–5.1)
Sodium: 139 mmol/L (ref 135–145)

## 2016-04-11 LAB — I-STAT TROPONIN, ED: Troponin i, poc: 0 ng/mL (ref 0.00–0.08)

## 2016-04-11 MED ORDER — PREDNISONE 20 MG PO TABS
60.0000 mg | ORAL_TABLET | Freq: Once | ORAL | Status: AC
  Administered 2016-04-12: 60 mg via ORAL
  Filled 2016-04-11: qty 3

## 2016-04-11 MED ORDER — AZITHROMYCIN 250 MG PO TABS
1000.0000 mg | ORAL_TABLET | Freq: Once | ORAL | Status: AC
Start: 2016-04-11 — End: 2016-04-12
  Administered 2016-04-12: 1000 mg via ORAL
  Filled 2016-04-11: qty 4

## 2016-04-11 MED ORDER — IPRATROPIUM-ALBUTEROL 0.5-2.5 (3) MG/3ML IN SOLN
3.0000 mL | Freq: Once | RESPIRATORY_TRACT | Status: AC
  Administered 2016-04-12: 3 mL via RESPIRATORY_TRACT
  Filled 2016-04-11: qty 3

## 2016-04-11 MED ORDER — CEFTRIAXONE SODIUM 250 MG IJ SOLR
250.0000 mg | Freq: Once | INTRAMUSCULAR | Status: AC
  Administered 2016-04-12: 250 mg via INTRAMUSCULAR
  Filled 2016-04-11: qty 250

## 2016-04-11 NOTE — ED Triage Notes (Addendum)
Per Pt, PT is coming from home with complaints of chest pain that worsens with deep breathing and urinary burning. Pt had a Hx of cough and reports that is has resolved. Denies fever. Complains of SOB. White Penile discharge noted by patient.

## 2016-04-11 NOTE — ED Provider Notes (Signed)
[facility]-EMERGENCY DEPT Provider Note   CSN: [id number] Arrival date & time: 04/11/16  1716   By signing my name below, I, Clovis Pu, attest that this documentation has been prepared under the direction and in the presence of Gilda Crease, MD  Electronically Signed: Clovis Pu, ED Scribe. 04/11/16. 11:26 PM.   History   Chief Complaint Chief Complaint  Patient presents with  . Cough    The history is provided by the patient. No language interpreter was used.   HPI Comments:  Chad Douglas is a 21 y.o. male, with a hx of asthma, who presents to the Emergency Department complaining of persistent cough with associated chest tightness x 1 week.  His symptoms are worse when he breathes and when he tries to smoke a cigarette. Pt has been using his inhaler, theraflu and mucinex with no relief. Pt denies any other associated symptoms and any other modifying factors at this time. Pt is a smoker.   Pt also complains of sudden onset white penile discharge x several days.   Past Medical History:  Diagnosis Date  . Asthma     There are no active problems to display for this patient.   History reviewed. No pertinent surgical history.   Home Medications    Prior to Admission medications   Medication Sig Start Date End Date Taking? Authorizing Provider  ibuprofen (ADVIL,MOTRIN) 800 MG tablet Take 1 tablet (800 mg total) by mouth once. 03/13/15   Dione Booze, MD  ondansetron (ZOFRAN ODT) 4 MG disintegrating tablet 4mg  ODT q4 hours prn nausea/vomit 09/18/15   Dahlia Client Muthersbaugh, PA-C    Family History No family history on file.  Social History Social History  Substance Use Topics  . Smoking status: Current Every Day Smoker    Packs/day: 0.50    Types: Cigarettes  . Smokeless tobacco: Never Used  . Alcohol use No     Allergies   Shellfish allergy   Review of Systems Review of Systems  Respiratory: Positive for cough and chest tightness.   Genitourinary:  Positive for discharge.  All other systems reviewed and are negative.   Physical Exam Updated Vital Signs BP 147/78   Pulse 71   Temp 98.1 F (36.7 C) (Oral)   Resp 17   Ht 5\' 8"  (1.727 m)   Wt 180 lb (81.6 kg)   SpO2 100%   BMI 27.37 kg/m   Physical Exam  Constitutional: He is oriented to person, place, and time. He appears well-developed and well-nourished. No distress.  HENT:  Head: Normocephalic and atraumatic.  Right Ear: Hearing normal.  Left Ear: Hearing normal.  Nose: Nose normal.  Mouth/Throat: Oropharynx is clear and moist and mucous membranes are normal.  Eyes: Conjunctivae and EOM are normal. Pupils are equal, round, and reactive to light.  Neck: Normal range of motion. Neck supple.  Cardiovascular: Regular rhythm, S1 normal and S2 normal.  Exam reveals no gallop and no friction rub.   No murmur heard. Pulmonary/Chest: Effort normal and breath sounds normal. No respiratory distress. He exhibits no tenderness.  Abdominal: Soft. Normal appearance and bowel sounds are normal. There is no hepatosplenomegaly. There is no tenderness. There is no rebound, no guarding, no tenderness at McBurney's point and negative Murphy's sign. No hernia.  Genitourinary: Circumcised. Discharge found.  Musculoskeletal: Normal range of motion.  Neurological: He is alert and oriented to person, place, and time. He has normal strength. No cranial nerve deficit or sensory deficit. Coordination normal. GCS eye subscore  is 4. GCS verbal subscore is 5. GCS motor subscore is 6.  Skin: Skin is warm, dry and intact. No rash noted. No cyanosis.  Psychiatric: He has a normal mood and affect. His speech is normal and behavior is normal. Thought content normal.  Nursing note and vitals reviewed.   ED Treatments / Results  DIAGNOSTIC STUDIES:  Oxygen Saturation is 98% on RA, normal by my interpretation.    COORDINATION OF CARE:  11:25 PM Discussed treatment plan with pt at bedside and pt agreed to  plan.  Labs (all labs ordered are listed, but only abnormal results are displayed) Labs Reviewed  BASIC METABOLIC PANEL - Abnormal; Notable for the following:       Result Value   Glucose, Bld 132 (*)    All other components within normal limits  URINALYSIS, ROUTINE W REFLEX MICROSCOPIC - Abnormal; Notable for the following:    Leukocytes, UA TRACE (*)    Squamous Epithelial / LPF 0-5 (*)    All other components within normal limits  CBC  I-STAT TROPOININ, ED  GC/CHLAMYDIA PROBE AMP ([place]) NOT AT [facility]    EKG  EKG Interpretation None       Radiology Dg Chest 2 View  Result Date: 04/11/2016 CLINICAL DATA:  Central chest pain for 2-3 days. Tightness with breathing. History of asthma. Tobacco use. EXAM: CHEST  2 VIEW COMPARISON:  01/27/2011 FINDINGS: Airway thickening is present, suggesting bronchitis or reactive airways disease. Cardiac and mediastinal margins appear normal. No airspace opacity identified. No pleural effusion. IMPRESSION: 1. Airway thickening is present, suggesting bronchitis or reactive airways disease. Electronically Signed   By: Gaylyn RongWalter  Liebkemann M.D.   On: 04/11/2016 18:18    Procedures Procedures (including critical care time)  Medications Ordered in ED Medications  predniSONE (DELTASONE) tablet 60 mg (60 mg Oral Given 04/12/16 0009)  ipratropium-albuterol (DUONEB) 0.5-2.5 (3) MG/3ML nebulizer solution 3 mL (3 mLs Nebulization Given 04/12/16 0009)  cefTRIAXone (ROCEPHIN) injection 250 mg (250 mg Intramuscular Given 04/12/16 0009)  azithromycin (ZITHROMAX) tablet 1,000 mg (1,000 mg Oral Given 04/12/16 0010)  lidocaine (PF) (XYLOCAINE) 1 % injection (1 mL  Given 04/12/16 0011)     Initial Impression / Assessment and Plan / ED Course  I have reviewed the triage vital signs and the nursing notes.  Pertinent labs & imaging results that were available during my care of the patient were reviewed by me and considered in my medical decision making (see chart for  details).  Clinical Course    Patient presents to the ER with multiple problems. Patient experiencing upper respiratory infection type symptoms. He does have a history of asthma. He has been using an inhaler but reports that he has not improved. Currently there is no significant wheezing. Chest x-ray does not show pneumonia. Patient feeling improvement after DuoNeb. Will treat with prednisone, continue bronchodilator therapy.  Patient also concerned about urethral discharge and dysuria. Urinalysis unremarkable. Will treat empirically. Add-on GC and chlamydia to urine collection.  Final Clinical Impressions(s) / ED Diagnoses   Final diagnoses:  Urethritis  Bronchitis    New Prescriptions New Prescriptions   No medications on file  I personally performed the services described in this documentation, which was scribed in my presence. The recorded information has been reviewed and is accurate.     Gilda Creasehristopher J Pollina, MD 04/12/16 0120

## 2016-04-12 MED ORDER — LIDOCAINE HCL (PF) 1 % IJ SOLN
INTRAMUSCULAR | Status: AC
  Administered 2016-04-12: 1 mL
  Filled 2016-04-12: qty 5

## 2016-04-12 MED ORDER — BENZONATATE 100 MG PO CAPS: 100.0000 mg | ORAL_CAPSULE | Freq: Three times a day (TID) | ORAL | 0 refills | Status: DC

## 2016-04-12 MED ORDER — PREDNISONE 20 MG PO TABS: 60.0000 mg | ORAL_TABLET | Freq: Every day | ORAL | 0 refills | Status: DC

## 2020-05-14 ENCOUNTER — Ambulatory Visit (HOSPITAL_COMMUNITY)
Admission: EM | Admit: 2020-05-14 | Discharge: 2020-05-14 | Disposition: A | Discharge status: Home / Self Care | Payer: Self-pay

## 2020-05-14 ENCOUNTER — Other Ambulatory Visit: Payer: Self-pay

## 2020-05-14 ENCOUNTER — Emergency Department (HOSPITAL_COMMUNITY)
Admission: EM | Admit: 2020-05-14 | Discharge: 2020-05-15 | Disposition: A | Discharge status: Home / Self Care | Payer: Self-pay | Attending: Emergency Medicine | Admitting: Emergency Medicine

## 2020-05-14 DIAGNOSIS — W540XXA Bitten by dog, initial encounter: Secondary | ICD-10-CM | POA: Insufficient documentation

## 2020-05-14 DIAGNOSIS — Z23 Encounter for immunization: Secondary | ICD-10-CM | POA: Insufficient documentation

## 2020-05-14 DIAGNOSIS — F17210 Nicotine dependence, cigarettes, uncomplicated: Secondary | ICD-10-CM | POA: Insufficient documentation

## 2020-05-14 DIAGNOSIS — S91312A Laceration without foreign body, left foot, initial encounter: Secondary | ICD-10-CM | POA: Insufficient documentation

## 2020-05-14 DIAGNOSIS — J45909 Unspecified asthma, uncomplicated: Secondary | ICD-10-CM | POA: Insufficient documentation

## 2020-05-14 NOTE — ED Triage Notes (Signed)
Patient c/o dog bite on his Left Foot. Pt stated he got bitten by stray dog at 10 am today and wound don't stop bleeding. Pt c/o 8/10 legpain and numbness around the affected area.. Pt a/ox4.

## 2020-05-15 ENCOUNTER — Emergency Department (HOSPITAL_COMMUNITY): Payer: Self-pay

## 2020-05-15 MED ORDER — AMOXICILLIN-POT CLAVULANATE 875-125 MG PO TABS
1.0000 | ORAL_TABLET | Freq: Once | ORAL | Status: AC
  Administered 2020-05-15: 1 via ORAL
  Filled 2020-05-15: qty 1

## 2020-05-15 MED ORDER — RABIES IMMUNE GLOBULIN 150 UNIT/ML IM INJ
20.0000 [IU]/kg | INJECTION | Freq: Once | INTRAMUSCULAR | Status: AC
  Administered 2020-05-15: 1650 [IU] via INTRAMUSCULAR
  Filled 2020-05-15: qty 11

## 2020-05-15 MED ORDER — OXYCODONE HCL 5 MG PO TABS
10.0000 mg | ORAL_TABLET | Freq: Once | ORAL | Status: AC
  Administered 2020-05-15: 10 mg via ORAL
  Filled 2020-05-15: qty 2

## 2020-05-15 MED ORDER — HYDROCODONE-ACETAMINOPHEN 5-325 MG PO TABS: 1.0000 | ORAL_TABLET | Freq: Four times a day (QID) | ORAL | 0 refills | Status: DC | PRN

## 2020-05-15 MED ORDER — RABIES VACCINE, PCEC IM SUSR
1.0000 mL | Freq: Once | INTRAMUSCULAR | Status: AC
  Administered 2020-05-15: 1 mL via INTRAMUSCULAR
  Filled 2020-05-15: qty 1

## 2020-05-15 MED ORDER — AMOXICILLIN-POT CLAVULANATE 875-125 MG PO TABS: 1.0000 | ORAL_TABLET | Freq: Two times a day (BID) | ORAL | 0 refills | Status: DC

## 2020-05-15 MED ORDER — TETANUS-DIPHTH-ACELL PERTUSSIS 5-2.5-18.5 LF-MCG/0.5 IM SUSY
0.5000 mL | PREFILLED_SYRINGE | Freq: Once | INTRAMUSCULAR | Status: AC
  Administered 2020-05-15: 0.5 mL via INTRAMUSCULAR
  Filled 2020-05-15: qty 0.5

## 2020-05-15 NOTE — ED Provider Notes (Signed)
[facility]-EMERGENCY DEPT Provider Note   CSN: [id number] Arrival date & time: 05/14/20  2332     History Chief Complaint  Patient presents with  . Animal Bite    Chad Douglas is a 25 y.o. male  presents to the Emergency Department complaining of gradual, persistent, progressively worsening welling and pain in the left foot onset 10 AM this morning after being bitten by a stray dog.  Patient has no knowledge of the dog's owner or vaccine status.  Patient reports wound to the left foot has been persistently oozing blood throughout the day.  No treatments prior to arrival.  Walking makes symptoms worse.  Nothing seems to make them better.  Unknown last tetanus shot.  Triage note states leg pain and numbness around the affected area however further clarification with patient reveals foot pain and swelling but no numbness.   The history is provided by the patient and medical records. No language interpreter was used.       Past Medical History:  Diagnosis Date  . Asthma     There are no problems to display for this patient.   No past surgical history on file.     No family history on file.  Social History   Tobacco Use  . Smoking status: Current Every Day Smoker    Packs/day: 0.50    Types: Cigarettes  . Smokeless tobacco: Never Used  Substance Use Topics  . Alcohol use: No  . Drug use: No    Home Medications Prior to Admission medications   Medication Sig Start Date End Date Taking? Authorizing Provider  amoxicillin-clavulanate (AUGMENTIN) 875-125 MG tablet Take 1 tablet by mouth 2 (two) times daily. One po bid x 7 days 05/15/20  Yes Zelia Yzaguirre, Dahlia Client, PA-C  HYDROcodone-acetaminophen (NORCO/VICODIN) 5-325 MG tablet Take 1-2 tablets by mouth every 6 (six) hours as needed for severe pain. 05/15/20  Yes Israa Caban, Dahlia Client, PA-C  benzonatate (TESSALON) 100 MG capsule Take 1 capsule (100 mg total) by mouth every 8 (eight) hours. 04/12/16    Gilda Crease, MD  ibuprofen (ADVIL,MOTRIN) 800 MG tablet Take 1 tablet (800 mg total) by mouth once. 03/13/15   Dione Booze, MD  ondansetron [facility] ODT) 4 MG disintegrating tablet 4mg  ODT q4 hours prn nausea/vomit 09/18/15   Bienvenido Proehl, 09/20/15, PA-C  predniSONE (DELTASONE) 20 MG tablet Take 3 tablets (60 mg total) by mouth daily with breakfast. 04/12/16   Pollina, 06/10/16, MD    Allergies    Shellfish allergy  Review of Systems   Review of Systems  Constitutional: Negative for appetite change, diaphoresis, fatigue, fever and unexpected weight change.  HENT: Negative for mouth sores.   Eyes: Negative for visual disturbance.  Respiratory: Negative for cough, chest tightness, shortness of breath and wheezing.   Cardiovascular: Negative for chest pain.  Gastrointestinal: Negative for abdominal pain, constipation, diarrhea, nausea and vomiting.  Endocrine: Negative for polydipsia, polyphagia and polyuria.  Genitourinary: Negative for dysuria, frequency, hematuria and urgency.  Musculoskeletal: Positive for arthralgias and joint swelling. Negative for back pain and neck stiffness.  Skin: Positive for color change and wound. Negative for rash.  Allergic/Immunologic: Negative for immunocompromised state.  Neurological: Negative for syncope, light-headedness and headaches.  Hematological: Does not bruise/bleed easily.  Psychiatric/Behavioral: Negative for sleep disturbance. The patient is not nervous/anxious.     Physical Exam Updated Vital Signs BP (!) 174/108 (BP Location: Right Arm)   Pulse 80   Temp 98 F (36.7 C) (Oral)  Resp 16   Wt 83.3 kg   SpO2 99%   BMI 27.92 kg/m   Physical Exam Vitals and nursing note reviewed.  Constitutional:      General: He is not in acute distress.    Appearance: He is not diaphoretic.  HENT:     Head: Normocephalic.  Eyes:     General: No scleral icterus.    Conjunctiva/sclera: Conjunctivae normal.  Cardiovascular:     Rate  and Rhythm: Normal rate and regular rhythm.     Pulses: Normal pulses.          Radial pulses are 2+ on the right side and 2+ on the left side.  Pulmonary:     Effort: No tachypnea, accessory muscle usage, prolonged expiration, respiratory distress or retractions.     Breath sounds: No stridor.     Comments: Equal chest rise. No increased work of breathing. Abdominal:     General: There is no distension.     Palpations: Abdomen is soft.     Tenderness: There is no abdominal tenderness. There is no guarding or rebound.  Musculoskeletal:     Cervical back: Normal range of motion.     Left ankle: Normal.     Left Achilles Tendon: Normal.     Left foot: Normal range of motion and normal capillary refill. Swelling, laceration and tenderness present. No foot drop. Normal pulse.     Comments: Moves all extremities equally and without difficulty. Swelling and ecchymosis to the medial portion of the left foot with 2 cm laceration.  Hemostasis achieved prior to my evaluation.  Skin:    General: Skin is warm and dry.     Capillary Refill: Capillary refill takes less than 2 seconds.  Neurological:     Mental Status: He is alert.     GCS: GCS eye subscore is 4. GCS verbal subscore is 5. GCS motor subscore is 6.     Comments: Speech is clear and goal oriented. Patient intact to light touch throughout the left foot and around the wound. Strength 5/5 with dorsiflexion and plantarflexion at the ankle and of the great toe.  Psychiatric:        Mood and Affect: Mood normal.     ED Results / Procedures / Treatments   Labs (all labs ordered are listed, but only abnormal results are displayed) Labs Reviewed - No data to display  EKG None  Radiology DG Foot Complete Left  Result Date: 05/15/2020 CLINICAL DATA:  Dog bite with persistent bleeding. EXAM: LEFT FOOT - COMPLETE 3+ VIEW COMPARISON:  None. FINDINGS: No evidence of fracture. No radiopaque foreign object. No air/gas in any of the joints  identified. Medial soft tissue swelling of the midfoot. IMPRESSION: Medial soft tissue swelling of the midfoot. No evidence of fracture or radiopaque foreign object. Electronically Signed   By: Paulina Fusi M.D.   On: 05/15/2020 00:13    Procedures Procedures   Medications Ordered in ED Medications  rabies immune globulin (HYPERAB/KEDRAB) injection 1,650 Units (has no administration in time range)  rabies vaccine (RABAVERT) injection 1 mL (has no administration in time range)  oxyCODONE (Oxy IR/ROXICODONE) immediate release tablet 10 mg (10 mg Oral Given 05/15/20 0125)  Tdap (BOOSTRIX) injection 0.5 mL (0.5 mLs Intramuscular Given 05/15/20 0126)  amoxicillin-clavulanate (AUGMENTIN) 875-125 MG per tablet 1 tablet (1 tablet Oral Given 05/15/20 0125)    ED Course  I have reviewed the triage vital signs and the nursing notes.  Pertinent labs &  imaging results that were available during my care of the patient were reviewed by me and considered in my medical decision making (see chart for details).  Clinical Course as of 05/15/20 0127  Fri May 15, 2020  0111 BP(!): 174/108 Hypertension noted.  Patient very anxious. [HM]    Clinical Course User Index [HM] Daman Steffenhagen, Boyd Kerbs   MDM Rules/Calculators/A&P                           Patient presents with laceration from a dog bite.  Pt wounds irrigated well with 18ga angiocath with sterile saline.  Wounds examined with visualization of the base and no foreign bodies seen.  Pt Alert and oriented, NAD, nontoxic, nonseptic appearing.  Capillary refill intact and pt without neurologic deficit.  Left foot x-rays with no evidence of foreign body or fracture. Patient tetanus updated.  Patient rabies vaccine and immunoglobulin risk and benefit discussed.  Pt consents. Pain treated in the emergency department. Wounds not closed secondary to concern for infection. We'll discharge home with pain medication, Augmentin and requests for close follow-up with  PCP or back in the ER.   Final Clinical Impression(s) / ED Diagnoses Final diagnoses:  Dog bite, initial encounter    Rx / DC Orders ED Discharge Orders         Ordered    amoxicillin-clavulanate (AUGMENTIN) 875-125 MG tablet  2 times daily        05/15/20 0122    HYDROcodone-acetaminophen (NORCO/VICODIN) 5-325 MG tablet  Every 6 hours PRN        05/15/20 0122           Kely Dohn, Dahlia Client, PA-C 05/15/20 0127    Molpus, Jonny Ruiz, MD 05/15/20 0225

## 2020-05-15 NOTE — Discharge Instructions (Addendum)
1. Medications: Augmentin is her antibiotic.  Vicodin for severe pain.  Alternate naprosyn and tylenol for pain control, usual home medications 2. Treatment: rest, ice, elevate and use crutches as needed, drink plenty of fluids, gentle stretching 3. Follow Up: Please followup with orthopedics as directed or your PCP in 1 week if no improvement for discussion of your diagnoses and further evaluation after today's visit; if you do not have a primary care doctor use the resource guide provided to find one; Please return to the ER for worsening symptoms or other concerns

## 2020-09-09 ENCOUNTER — Other Ambulatory Visit: Payer: Self-pay

## 2020-09-09 ENCOUNTER — Encounter (HOSPITAL_COMMUNITY): Payer: Self-pay | Admitting: Emergency Medicine

## 2020-09-09 ENCOUNTER — Ambulatory Visit (HOSPITAL_COMMUNITY)
Admission: EM | Admit: 2020-09-09 | Discharge: 2020-09-09 | Disposition: A | Discharge status: Home / Self Care | Payer: Self-pay | Attending: Physician Assistant | Admitting: Physician Assistant

## 2020-09-09 DIAGNOSIS — Z711 Person with feared health complaint in whom no diagnosis is made: Secondary | ICD-10-CM | POA: Insufficient documentation

## 2020-09-09 DIAGNOSIS — R369 Urethral discharge, unspecified: Secondary | ICD-10-CM | POA: Insufficient documentation

## 2020-09-09 DIAGNOSIS — R030 Elevated blood-pressure reading, without diagnosis of hypertension: Secondary | ICD-10-CM | POA: Insufficient documentation

## 2020-09-09 DIAGNOSIS — Z113 Encounter for screening for infections with a predominantly sexual mode of transmission: Secondary | ICD-10-CM | POA: Insufficient documentation

## 2020-09-09 MED ORDER — CEFTRIAXONE SODIUM 500 MG IJ SOLR
500.0000 mg | Freq: Once | INTRAMUSCULAR | Status: AC
  Administered 2020-09-09: 13:00:00 500 mg via INTRAMUSCULAR

## 2020-09-09 MED ORDER — LIDOCAINE HCL (PF) 1 % IJ SOLN
INTRAMUSCULAR | Status: AC
  Filled 2020-09-09: qty 2

## 2020-09-09 MED ORDER — DOXYCYCLINE HYCLATE 100 MG PO CAPS: 100.0000 mg | ORAL_CAPSULE | Freq: Two times a day (BID) | ORAL | 0 refills | Status: AC

## 2020-09-09 MED ORDER — CEFTRIAXONE SODIUM 500 MG IJ SOLR
INTRAMUSCULAR | Status: AC
  Filled 2020-09-09: qty 500

## 2020-09-09 NOTE — ED Notes (Signed)
Cytology specimen in lab

## 2020-09-09 NOTE — Discharge Instructions (Signed)
We have treated you for gonorrhea and chlamydia.  We will be in touch with your lab results if we need to arrange treatment for trichomonas as we discussed.  Please abstain from sexual contact for 7 days after starting treatment.  If you have any worsening symptoms please return for reevaluation.  Your blood pressure was very elevated today.  Please monitor this at home and if it remains above 140/90 you need to be reevaluated.  I would like someone to recheck this within 1 week.  If you develop any chest pain, shortness of breath, leg swelling, headache, dizziness you need to go to the emergency room.

## 2020-09-09 NOTE — ED Provider Notes (Signed)
[facility]    CSN: [id number] Arrival date & time: 09/09/20  1151      History   Chief Complaint Chief Complaint  Patient presents with  . SEXUALLY TRANSMITTED DISEASE  . Penile Discharge    HPI Chad Douglas is a 25 y.o. male.   Patient presents today with a 1 week history of penile discharge.  He denies any associated urinary symptoms including dysuria, urinary frequency, urinary urgency.  He is concerned he was exposed to an STI and would like to be treated for everything but denies specific exposure.  He denies any genital lesions, abdominal pain, nausea, vomiting, fever.  He denies any recent antibiotic use.  Patient's blood pressure was noted to be very elevated on intake today.  When discussing this with him he reports it is because he is very upset at having to come in and be tested for STIs.  He denies history of hypertension does not take any antihypertensive medications.  He denies decongestant or caffeine use.  He denies associated symptoms including chest pain, shortness of breath, peripheral edema, headache, visual disturbance.     Past Medical History:  Diagnosis Date  . Asthma     There are no problems to display for this patient.   History reviewed. No pertinent surgical history.     Home Medications    Prior to Admission medications   Medication Sig Start Date End Date Taking? Authorizing Provider  doxycycline (VIBRAMYCIN) 100 MG capsule Take 1 capsule (100 mg total) by mouth 2 (two) times daily for 7 days. 09/09/20 09/16/20 Yes Montrell Cessna, Noberto Retort, PA-C    Family History History reviewed. No pertinent family history.  Social History Social History   Tobacco Use  . Smoking status: Current Every Day Smoker    Packs/day: 0.50    Types: Cigarettes  . Smokeless tobacco: Never Used  Substance Use Topics  . Alcohol use: No  . Drug use: No     Allergies   Shellfish allergy   Review of Systems Review of Systems  Constitutional:  Negative for activity change, appetite change, fatigue and fever.  Respiratory: Negative for cough and shortness of breath.   Cardiovascular: Negative for chest pain.  Gastrointestinal: Negative for abdominal pain, diarrhea, nausea and vomiting.  Genitourinary: Positive for penile discharge. Negative for dysuria, frequency, genital sores, penile pain and urgency.  Neurological: Negative for dizziness, light-headedness and headaches.     Physical Exam Triage Vital Signs ED Triage Vitals  Enc Vitals Group     BP 09/09/20 1208 (!) 175/90     Pulse Rate 09/09/20 1208 97     Resp 09/09/20 1208 18     Temp 09/09/20 1210 98.6 F (37 C)     Temp Source 09/09/20 1210 Temporal     SpO2 09/09/20 1208 100 %     Weight --      Height --      Head Circumference --      Peak Flow --      Pain Score 09/09/20 1209 0     Pain Loc --      Pain Edu? --      Excl. in GC? --    No data found.  Updated Vital Signs BP (!) 146/100 (BP Location: Right Arm)   Pulse (!) 118   Temp 98.6 F (37 C) (Temporal)   Resp 18   SpO2 100%   Visual Acuity Right Eye Distance:   Left Eye Distance:  Bilateral Distance:    Right Eye Near:   Left Eye Near:    Bilateral Near:     Physical Exam Vitals reviewed.  Constitutional:      General: He is awake.     Appearance: Normal appearance. He is normal weight. He is not ill-appearing.     Comments: Very pleasant male appears stated age in no acute distress  HENT:     Head: Normocephalic and atraumatic.  Cardiovascular:     Rate and Rhythm: Normal rate and regular rhythm.     Heart sounds: Normal heart sounds. No murmur heard.   Pulmonary:     Effort: Pulmonary effort is normal.     Breath sounds: Normal breath sounds. No stridor. No wheezing, rhonchi or rales.     Comments: Clear to auscultation bilaterally Abdominal:     General: Bowel sounds are normal.     Palpations: Abdomen is soft.     Tenderness: There is no abdominal tenderness.      Comments: Benign abdominal exam  Genitourinary:    Comments: Exam deferred Neurological:     Mental Status: He is alert.  Psychiatric:        Behavior: Behavior is cooperative.      UC Treatments / Results  Labs (all labs ordered are listed, but only abnormal results are displayed) Labs Reviewed  CYTOLOGY, (ORAL, ANAL, URETHRAL) ANCILLARY ONLY    EKG   Radiology No results found.  Procedures Procedures (including critical care time)  Medications Ordered in UC Medications  cefTRIAXone (ROCEPHIN) injection 500 mg (500 mg Intramuscular Given 09/09/20 1246)    Initial Impression / Assessment and Plan / UC Course  I have reviewed the triage vital signs and the nursing notes.  Pertinent labs & imaging results that were available during my care of the patient were reviewed by me and considered in my medical decision making (see chart for details).     Given penile discharge patient was empirically treated for gonorrhea and chlamydia.  He was given Rocephin 500 mg in clinic today and started on doxycycline 100 mg twice daily for 7 days.  Discussed he should avoid prolonged sun exposure due to photosensitivity associated with doxycycline.  Discussed he should abstain from intercourse for 7 days from onset of treatment.  All partner should be tested and treated as well.  Discussed that if he is positive for trichomonas based on STI swab collected today we will contact him to arrange additional treatment.  Discussed alarm symptoms that warrant emergent evaluation.  Strict return precautions given to which patient expressed understanding.  Patient's blood pressure was elevated on intake.  Repeat blood pressure remained elevated but improved to 146/100.  He denies any symptoms of endorgan damage and believes that blood pressure is elevated due to anxiety/stress.  He was encouraged to monitor this at home and instructed to follow-up with either our clinic or PCP if this remains above 140/90.   Discussed that if he has any alarm symptoms including chest pain, shortness of breath, vision changes, headache with elevated blood pressure he is to go to the emergency room.  Recommended he avoid decongestants and salt.  All questions answered to patient satisfaction.  Final Clinical Impressions(s) / UC Diagnoses   Final diagnoses:  Penile discharge  Routine screening for STI (sexually transmitted infection)  Concern about STD in male without diagnosis  Elevated blood pressure reading     Discharge Instructions     We have treated you for gonorrhea and  chlamydia.  We will be in touch with your lab results if we need to arrange treatment for trichomonas as we discussed.  Please abstain from sexual contact for 7 days after starting treatment.  If you have any worsening symptoms please return for reevaluation.  Your blood pressure was very elevated today.  Please monitor this at home and if it remains above 140/90 you need to be reevaluated.  I would like someone to recheck this within 1 week.  If you develop any chest pain, shortness of breath, leg swelling, headache, dizziness you need to go to the emergency room.    ED Prescriptions    Medication Sig Dispense Auth. Provider   doxycycline (VIBRAMYCIN) 100 MG capsule Take 1 capsule (100 mg total) by mouth 2 (two) times daily for 7 days. 14 capsule Sayde Lish K, PA-C     PDMP not reviewed this encounter.   Jeani Hawking, PA-C 09/09/20 1303

## 2020-09-09 NOTE — ED Triage Notes (Signed)
Pt is present today with penile discharge and exposure to STD. Pt state that he noticed the discharge last week

## 2020-09-10 ENCOUNTER — Telehealth (HOSPITAL_COMMUNITY): Payer: Self-pay | Admitting: Physician Assistant

## 2020-09-10 ENCOUNTER — Ambulatory Visit (HOSPITAL_COMMUNITY)
Admission: EM | Admit: 2020-09-10 | Discharge: 2020-09-10 | Disposition: A | Discharge status: Home / Self Care | Payer: Self-pay

## 2020-09-10 LAB — CYTOLOGY, (ORAL, ANAL, URETHRAL) ANCILLARY ONLY
Chlamydia: NEGATIVE
Comment: NEGATIVE
Comment: NEGATIVE
Comment: NORMAL
Neisseria Gonorrhea: NEGATIVE
Trichomonas: POSITIVE — AB

## 2020-09-10 MED ORDER — METRONIDAZOLE 500 MG PO TABS: 2000.0000 mg | ORAL_TABLET | Freq: Once | ORAL | 0 refills | Status: AC

## 2020-09-10 NOTE — Telephone Encounter (Signed)
Patient returned today after reviewing test results on my concern he is positive for trichomonas.  He was seen yesterday (09/09/2020) and given Rocephin and started on doxycycline.  He can discontinue doxycycline and start metronidazole.  2 g metronidazole single dose sent to pharmacy.  Recommended he return in 4 weeks for test of cure.  He is to avoid sexual contact for 7 days from date of treatment.  All partners will need to be tested and treated.

## 2023-11-30 ENCOUNTER — Other Ambulatory Visit: Payer: Self-pay

## 2023-11-30 ENCOUNTER — Encounter (HOSPITAL_COMMUNITY): Payer: Self-pay

## 2023-11-30 ENCOUNTER — Emergency Department (HOSPITAL_COMMUNITY)
Admission: EM | Admit: 2023-11-30 | Discharge: 2023-11-30 | Disposition: A | Discharge status: Home / Self Care | Payer: MEDICAID | Attending: Emergency Medicine | Admitting: Emergency Medicine

## 2023-11-30 DIAGNOSIS — Z202 Contact with and (suspected) exposure to infections with a predominantly sexual mode of transmission: Secondary | ICD-10-CM | POA: Insufficient documentation

## 2023-11-30 DIAGNOSIS — J45909 Unspecified asthma, uncomplicated: Secondary | ICD-10-CM | POA: Insufficient documentation

## 2023-11-30 MED ORDER — CEFTRIAXONE SODIUM 500 MG IJ SOLR
500.0000 mg | Freq: Once | INTRAMUSCULAR | Status: AC
  Administered 2023-11-30: 500 mg via INTRAMUSCULAR
  Filled 2023-11-30: qty 500

## 2023-11-30 MED ORDER — DOXYCYCLINE HYCLATE 100 MG PO TABS
100.0000 mg | ORAL_TABLET | Freq: Once | ORAL | Status: AC
  Administered 2023-11-30: 100 mg via ORAL
  Filled 2023-11-30: qty 1

## 2023-11-30 MED ORDER — DOXYCYCLINE HYCLATE 100 MG PO CAPS: 100.0000 mg | ORAL_CAPSULE | Freq: Two times a day (BID) | ORAL | 0 refills | Status: AC

## 2023-11-30 MED ORDER — LIDOCAINE HCL (PF) 1 % IJ SOLN
1.0000 mL | Freq: Once | INTRAMUSCULAR | Status: AC
  Administered 2023-11-30: 2 mL
  Filled 2023-11-30: qty 5

## 2023-11-30 NOTE — ED Triage Notes (Signed)
 Pt states he was having sex with a male and didn't know she had a husband. Girl stated she has ghonrrhea and pt would like to be checked for STD. No urinary sx or discharge.

## 2023-11-30 NOTE — ED Provider Notes (Signed)
 [place] EMERGENCY DEPARTMENT AT [facility] Provider Note   CSN: [id number] Arrival date & time: 11/30/23  1442     Patient presents with: Exposure to STD   Chad Douglas is a 28 y.o. male.   Pt is a 28 yo male with pmhx significant for asthma.  He had unprotected sex with a woman who told him that she had gonorrhea.  He has no sx, but wants to be treated.  He did not want to be checked for HIV or syphilis.         Prior to Admission medications   Medication Sig Start Date End Date Taking? Authorizing Provider  doxycycline  (VIBRAMYCIN ) 100 MG capsule Take 1 capsule (100 mg total) by mouth 2 (two) times daily. 11/30/23  Yes Dean Clarity, MD    Allergies: Shellfish allergy    Review of Systems  All other systems reviewed and are negative.   Updated Vital Signs BP (!) 142/90   Pulse 62   Temp 98.5 F (36.9 C)   Resp 18   Ht 5' 8 (1.727 m)   Wt 110.7 kg   SpO2 98%   BMI 37.10 kg/m   Physical Exam Vitals and nursing note reviewed. Exam conducted with a chaperone present.  Constitutional:      Appearance: Normal appearance.  HENT:     Head: Normocephalic and atraumatic.     Right Ear: External ear normal.     Left Ear: External ear normal.     Nose: Nose normal.     Mouth/Throat:     Mouth: Mucous membranes are moist.     Pharynx: Oropharynx is clear.  Eyes:     Extraocular Movements: Extraocular movements intact.     Conjunctiva/sclera: Conjunctivae normal.     Pupils: Pupils are equal, round, and reactive to light.  Cardiovascular:     Rate and Rhythm: Normal rate and regular rhythm.     Pulses: Normal pulses.     Heart sounds: Normal heart sounds.  Pulmonary:     Effort: Pulmonary effort is normal.     Breath sounds: Normal breath sounds.  Abdominal:     General: Abdomen is flat. Bowel sounds are normal.     Palpations: Abdomen is soft.  Genitourinary:    Penis: Circumcised.      Comments: No lesions Musculoskeletal:         General: Normal range of motion.     Cervical back: Normal range of motion and neck supple.  Skin:    General: Skin is warm.     Capillary Refill: Capillary refill takes less than 2 seconds.  Neurological:     General: No focal deficit present.     Mental Status: He is alert and oriented to person, place, and time.  Psychiatric:        Mood and Affect: Mood normal.        Behavior: Behavior normal.     (all labs ordered are listed, but only abnormal results are displayed) Labs Reviewed  GC/CHLAMYDIA PROBE AMP ([place]) NOT AT [facility]    EKG: None  Radiology: No results found.   Procedures   Medications Ordered in the ED  cefTRIAXone  (ROCEPHIN ) injection 500 mg (500 mg Intramuscular Given 11/30/23 2126)  lidocaine  (PF) (XYLOCAINE ) 1 % injection 1-2.1 mL (2 mLs Other Given 11/30/23 2126)  doxycycline  (VIBRA -TABS) tablet 100 mg (100 mg Oral Given 11/30/23 2126)  Medical Decision Making Risk Prescription drug management.   This patient presents to the ED for concern of std, this involves an extensive number of treatment options, and is a complaint that carries with it a high risk of complications and morbidity.  The differential diagnosis includes gonorrhea or chlamydia   Co morbidities that complicate the patient evaluation  asthma   Additional history obtained:  Additional history obtained from epic chart review  Medicines ordered and prescription drug management:  I ordered medication including rocephin /doxy  for sx  Reevaluation of the patient after these medicines showed that the patient improved I have reviewed the patients home medicines and have made adjustments as needed   Problem List / ED Course:  Exposure to gonorrhea:  pt tx'd with rocephin  and doxy.  He declined HIV and RPR testing.  He knows to return if worse and to use condoms when having sex.  Social Determinants of Health:  Lives at  home   Dispostion:  After consideration of the diagnostic results and the patients response to treatment, I feel that the patent would benefit from discharge with outpatient f/u.       Final diagnoses:  STD exposure    ED Discharge Orders          Ordered    doxycycline  (VIBRAMYCIN ) 100 MG capsule  2 times daily        11/30/23 2135               Dean Clarity, MD 11/30/23 2230

## 2023-11-30 NOTE — ED Notes (Signed)
 Patient discharged from ED and stated understanding of dc instructions. Agreed to pick up Rx and follow up at community clinic. Ambulatory out of ED.

## 2023-12-01 LAB — GC/CHLAMYDIA PROBE AMP (~~LOC~~) NOT AT ARMC
Chlamydia: NEGATIVE
Comment: NEGATIVE
Comment: NORMAL
Neisseria Gonorrhea: NEGATIVE
